# Patient Record
Sex: Male | Born: 1960
Health system: Southern US, Community
[De-identification: ages and names within clinical notes are randomized; demographics above are authoritative.]

---

## 1998-10-24 ENCOUNTER — Emergency Department (HOSPITAL_COMMUNITY): Admission: EM | Admit: 1998-10-24 | Discharge: 1998-10-24 | Payer: Self-pay

## 2000-11-04 ENCOUNTER — Emergency Department (HOSPITAL_COMMUNITY): Admission: EM | Admit: 2000-11-04 | Discharge: 2000-11-04 | Payer: Self-pay | Admitting: Emergency Medicine

## 2000-11-04 ENCOUNTER — Encounter: Payer: Self-pay | Admitting: Emergency Medicine

## 2005-08-14 ENCOUNTER — Encounter: Admission: RE | Admit: 2005-08-14 | Discharge: 2005-08-14 | Payer: Self-pay | Admitting: *Deleted

## 2016-03-10 DIAGNOSIS — H5213 Myopia, bilateral: Secondary | ICD-10-CM | POA: Diagnosis not present

## 2016-09-20 DIAGNOSIS — C7989 Secondary malignant neoplasm of other specified sites: Secondary | ICD-10-CM | POA: Diagnosis not present

## 2016-09-20 DIAGNOSIS — Z85238 Personal history of other malignant neoplasm of thymus: Secondary | ICD-10-CM | POA: Diagnosis not present

## 2016-10-27 ENCOUNTER — Ambulatory Visit: Payer: 59

## 2016-11-10 ENCOUNTER — Ambulatory Visit (INDEPENDENT_AMBULATORY_CARE_PROVIDER_SITE_OTHER): Payer: 59 | Admitting: Family Medicine

## 2016-11-10 DIAGNOSIS — Z23 Encounter for immunization: Secondary | ICD-10-CM

## 2016-12-04 ENCOUNTER — Ambulatory Visit (INDEPENDENT_AMBULATORY_CARE_PROVIDER_SITE_OTHER): Payer: 59 | Admitting: Family Medicine

## 2016-12-04 ENCOUNTER — Encounter: Payer: Self-pay | Admitting: Family Medicine

## 2016-12-04 VITALS — BP 110/78 | HR 64 | Temp 98.1°F | Resp 14 | Ht 70.0 in | Wt 173.0 lb

## 2016-12-04 DIAGNOSIS — J329 Chronic sinusitis, unspecified: Secondary | ICD-10-CM

## 2016-12-04 MED ORDER — AMOXICILLIN 875 MG PO TABS: 875.0000 mg | ORAL_TABLET | Freq: Two times a day (BID) | ORAL | 0 refills | Status: DC

## 2016-12-04 MED ORDER — PREDNISONE 20 MG PO TABS: ORAL_TABLET | ORAL | 0 refills | Status: DC

## 2016-12-04 NOTE — Progress Notes (Signed)
   Subjective:    Patient ID: Timothy Ingram, male    DOB: 02/19/1961, 56 y.o.   MRN: [id number]  HPI Symptoms started approximately 10 days ago.  Symptoms consist of  Pressure and congestion in his frontal and maxillary sinuses. Postnasal drip. Constant rhinorrhea. Sinus headaches in his frontal sinuses bilaterally. Subjective fevers and body aches.  Denies cough.  SOre throat due to PND. No past medical history on file. No past surgical history on file. No current outpatient prescriptions on file prior to visit.   No current facility-administered medications on file prior to visit.    No Known Allergies Social History   Social History  . Marital status: Single    Spouse name: N/A  . Number of children: N/A  . Years of education: N/A   Occupational History  . Not on file.   Social History Main Topics  . Smoking status: Never Smoker  . Smokeless tobacco: Never Used  . Alcohol use Not on file  . Drug use: Unknown  . Sexual activity: Not on file   Other Topics Concern  . Not on file   Social History Narrative  . No narrative on file      Review of Systems  All other systems reviewed and are negative.      Objective:   Physical Exam  Constitutional: He appears well-developed and well-nourished.  HENT:  Right Ear: Tympanic membrane, external ear and ear canal normal.  Left Ear: Tympanic membrane, external ear and ear canal normal.  Nose: Mucosal edema and rhinorrhea present. Right sinus exhibits frontal sinus tenderness. Left sinus exhibits frontal sinus tenderness.  Mouth/Throat: Oropharynx is clear and moist. No oropharyngeal exudate.  Eyes: Conjunctivae are normal.  Neck: Neck supple.  Cardiovascular: Normal rate and regular rhythm.   No murmur heard. Pulmonary/Chest: Effort normal and breath sounds normal. No respiratory distress. He has no wheezes. He has no rales.  Lymphadenopathy:    He has no cervical adenopathy.  Vitals reviewed.           Assessment & Plan:  Rhinosinusitis - Plan: amoxicillin (AMOXIL) 875 MG tablet, predniSONE (DELTASONE) 20 MG tablet  Start amoxicillin 875 mg by mouth twice a day for 10 days as well as prednisone taper  pack. Reassess in one week if no better or sooner if worse

## 2017-01-15 ENCOUNTER — Ambulatory Visit: Payer: 59

## 2017-01-19 ENCOUNTER — Ambulatory Visit: Payer: 59

## 2017-02-23 ENCOUNTER — Ambulatory Visit (INDEPENDENT_AMBULATORY_CARE_PROVIDER_SITE_OTHER): Payer: 59

## 2017-02-23 DIAGNOSIS — Z23 Encounter for immunization: Secondary | ICD-10-CM

## 2017-02-23 DIAGNOSIS — N400 Enlarged prostate without lower urinary tract symptoms: Secondary | ICD-10-CM | POA: Diagnosis not present

## 2017-02-23 NOTE — Progress Notes (Signed)
Patient was seen in office for shingrix vaccine. Patient received vaccine in his right deltoid.Patient tolerated well

## 2017-11-16 ENCOUNTER — Ambulatory Visit (INDEPENDENT_AMBULATORY_CARE_PROVIDER_SITE_OTHER): Payer: 59

## 2017-11-16 DIAGNOSIS — Z23 Encounter for immunization: Secondary | ICD-10-CM

## 2017-11-16 NOTE — Progress Notes (Signed)
Patient came in today to receive his annual influenza vaccine. Patient was given fluarix in the left deltoid. Patient tolerated well. VIS given to patient.

## 2018-03-29 DIAGNOSIS — N400 Enlarged prostate without lower urinary tract symptoms: Secondary | ICD-10-CM | POA: Diagnosis not present

## 2018-09-30 ENCOUNTER — Other Ambulatory Visit: Payer: Self-pay

## 2018-10-01 ENCOUNTER — Encounter: Payer: Self-pay | Admitting: Family Medicine

## 2018-10-01 ENCOUNTER — Ambulatory Visit (INDEPENDENT_AMBULATORY_CARE_PROVIDER_SITE_OTHER): Payer: 59 | Admitting: Family Medicine

## 2018-10-01 VITALS — BP 120/68 | HR 48 | Temp 97.7°F | Resp 16 | Ht 70.0 in | Wt 166.0 lb

## 2018-10-01 DIAGNOSIS — Z23 Encounter for immunization: Secondary | ICD-10-CM

## 2018-10-01 DIAGNOSIS — Z0001 Encounter for general adult medical examination with abnormal findings: Secondary | ICD-10-CM | POA: Diagnosis not present

## 2018-10-01 DIAGNOSIS — M898X1 Other specified disorders of bone, shoulder: Secondary | ICD-10-CM

## 2018-10-01 DIAGNOSIS — Z13220 Encounter for screening for lipoid disorders: Secondary | ICD-10-CM | POA: Diagnosis not present

## 2018-10-01 DIAGNOSIS — Z0000 Encounter for general adult medical examination without abnormal findings: Secondary | ICD-10-CM

## 2018-10-01 NOTE — Progress Notes (Signed)
Subjective:    Patient ID: Timothy Ingram, male    DOB: 23-Dec-1960, 58 y.o.   MRN: [id number]  HPI Patient is a very pleasant very healthy 58 year old Caucasian male here today for complete physical exam.  He has 1 medical concern.  He states that over the last month or so he has noticed that the proximal portion of his right clavicle seems to be swollen.  He also reports that with physical activity, it begins to hurt and is sore.  He denies any injury or trauma either currently or previously.  There is some mild prominence of the proximal portion of his right clavicle adjacent to the sternum however there is no palpable defect or swelling.  It is only minimally tender to palpation.  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  Otherwise the patient is doing well.  His last colonoscopy was at age 23.  He is due for that next year.  He denies any blood in his stool.  He denies any gastrointestinal problems.  He sees a urologist every year he checks his prostate and also checks a PSA.  Therefore this is up-to-date.  I reviewed his immunization records below: Immunization History  Administered Date(s) Administered  . Influenza,inj,Quad PF,6+ Mos 11/10/2016, 11/16/2017  . Zoster Recombinat (Shingrix) 11/10/2016, 02/23/2017   He is due for a flu shot this fall.  He is due for a booster on his tetanus today.  He elects to receive that given his profession.  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 No past medical history on file.  No current outpatient medications on file prior to visit.   No current facility-administered medications on file prior to visit.     No Known Allergies Social History   Socioeconomic History  . Marital status: Single    Spouse name: Not on file  . Number of children: Not on file  . Years of education: Not on file  . Highest education level: Not on file  Occupational History  . Not on file  Social  Needs  . Financial resource strain: Not on file  . Food insecurity    Worry: Not on file    Inability: Not on file  . Transportation needs    Medical: Not on file    Non-medical: Not on file  Tobacco Use  . Smoking status: Never Smoker  . Smokeless tobacco: Never Used  Substance and Sexual Activity  . Alcohol use: Not on file  . Drug use: Not on file  . Sexual activity: Not on file  Lifestyle  . Physical activity    Days per week: Not on file    Minutes per session: Not on file  . Stress: Not on file  Relationships  . Social Herbalist on phone: Not on file    Gets together: Not on file    Attends religious service: Not on file    Active member of club or organization: Not on file    Attends meetings of clubs or organizations: Not on file    Relationship status: Not on file  . Intimate partner violence    Fear of current or ex partner: Not on file    Emotionally abused: Not on file    Physically abused: Not on file    Forced sexual activity: Not on file  Other Topics Concern  . Not on file  Social History Narrative  . Not on  file   Patient does not smoke.  He does not drink.  He does not chew or dip tobacco.  He works as a Clinical cytogeneticist for [employer].  He is married to [place].  She has malignant thymoma and is battling cancer. No family history on file. Father died recently from heart disease at an advanced age.  Patient denies any family history of colon cancer or prostate cancer.  Review of Systems  All other systems reviewed and are negative.      Objective:   Physical Exam Vitals signs reviewed.  Constitutional:      General: He is not in acute distress.    Appearance: Normal appearance. He is normal weight. He is not ill-appearing, toxic-appearing or diaphoretic.  HENT:     Head: Normocephalic and atraumatic.     Right Ear: Tympanic membrane, ear canal and external ear normal. There is no impacted cerumen.     Left Ear: Ear canal and  external ear normal. There is no impacted cerumen.     Nose: Nose normal. No congestion or rhinorrhea.     Mouth/Throat:     Mouth: Mucous membranes are moist.     Pharynx: Oropharynx is clear. No oropharyngeal exudate or posterior oropharyngeal erythema.  Eyes:     General: No scleral icterus.       Right eye: No discharge.        Left eye: No discharge.     Extraocular Movements: Extraocular movements intact.     Conjunctiva/sclera: Conjunctivae normal.     Pupils: Pupils are equal, round, and reactive to light.  Neck:     Musculoskeletal: Normal range of motion. No neck rigidity or muscular tenderness.     Vascular: No carotid bruit.  Cardiovascular:     Rate and Rhythm: Normal rate and regular rhythm.     Pulses: Normal pulses.     Heart sounds: Normal heart sounds. No murmur. No friction rub. No gallop.   Pulmonary:     Effort: Pulmonary effort is normal. No respiratory distress.     Breath sounds: Normal breath sounds. No stridor. No wheezing, rhonchi or rales.  Chest:     Chest wall: No tenderness.     Breasts:        Left: Tenderness present.    Abdominal:     General: Bowel sounds are normal. There is no distension.     Palpations: Abdomen is soft. There is no mass.     Tenderness: There is no abdominal tenderness. There is no guarding or rebound.     Hernia: No hernia is present.  Musculoskeletal:        General: Tenderness present. No swelling, deformity or signs of injury.     Right lower leg: No edema.     Left lower leg: No edema.  Lymphadenopathy:     Cervical: No cervical adenopathy.  Skin:    General: Skin is warm.     Coloration: Skin is not jaundiced or pale.     Findings: No bruising, erythema, lesion or rash.  Neurological:     General: No focal deficit present.     Mental Status: He is alert and oriented to person, place, and time. Mental status is at baseline.     Cranial Nerves: No cranial nerve deficit.     Sensory: No sensory deficit.      Motor: No weakness.     Coordination: Coordination normal.     Gait: Gait normal.  Deep Tendon Reflexes: Reflexes normal.  Psychiatric:        Mood and Affect: Mood normal.        Behavior: Behavior normal.        Thought Content: Thought content normal.        Judgment: Judgment normal.           Assessment & Plan:  The primary encounter diagnosis was General medical exam. Diagnoses of Pain of right clavicle and Screening cholesterol level were also pertinent to this visit. Physical exam today is completely normal.  Blood pressure is excellent.  I will check a CBC, CMP, and fasting lipid panel.  Colonoscopy is up-to-date.  Prostate and PSA are checked by urologist.  Patient received his tetanus shot today.  The remainder of his physical exam is normal aside from the tenderness over his proximal right clavicle.  We will obtain an x-ray however I suspect this is osteoarthritic change due to a previous injury.  We will obtain x-ray to rule out any pathologic features.

## 2018-10-01 NOTE — Addendum Note (Signed)
Addended by: Shary Decamp B on: 10/01/2018 11:02 AM   Modules accepted: Orders

## 2018-10-02 LAB — CBC WITH DIFFERENTIAL/PLATELET
Absolute Monocytes: 409 cells/uL (ref 200–950)
Basophils Absolute: 61 cells/uL (ref 0–200)
Basophils Relative: 1.3 %
Eosinophils Absolute: 71 cells/uL (ref 15–500)
Eosinophils Relative: 1.5 %
HCT: 44.8 % (ref 38.5–50.0)
Hemoglobin: 15.3 g/dL (ref 13.2–17.1)
Lymphs Abs: 1053 cells/uL (ref 850–3900)
MCH: 31.1 pg (ref 27.0–33.0)
MCHC: 34.2 g/dL (ref 32.0–36.0)
MCV: 91.1 fL (ref 80.0–100.0)
MPV: 9.9 fL (ref 7.5–12.5)
Monocytes Relative: 8.7 %
Neutro Abs: 3107 cells/uL (ref 1500–7800)
Neutrophils Relative %: 66.1 %
Platelets: 217 10*3/uL (ref 140–400)
RBC: 4.92 10*6/uL (ref 4.20–5.80)
RDW: 12.3 % (ref 11.0–15.0)
Total Lymphocyte: 22.4 %
WBC: 4.7 10*3/uL (ref 3.8–10.8)

## 2018-10-02 LAB — COMPLETE METABOLIC PANEL WITH GFR
AG Ratio: 1.9 (calc) (ref 1.0–2.5)
ALT: 16 U/L (ref 9–46)
AST: 19 U/L (ref 10–35)
Albumin: 4.2 g/dL (ref 3.6–5.1)
Alkaline phosphatase (APISO): 71 U/L (ref 35–144)
BUN: 11 mg/dL (ref 7–25)
CO2: 29 mmol/L (ref 20–32)
Calcium: 9.3 mg/dL (ref 8.6–10.3)
Chloride: 104 mmol/L (ref 98–110)
Creat: 0.97 mg/dL (ref 0.70–1.33)
GFR, Est African American: 99 mL/min/{1.73_m2} (ref >=60)
GFR, Est Non African American: 85 mL/min/{1.73_m2} (ref >=60)
Globulin: 2.2 g/dL (calc) (ref 1.9–3.7)
Glucose, Bld: 84 mg/dL (ref 65–99)
Potassium: 4.3 mmol/L (ref 3.5–5.3)
Sodium: 140 mmol/L (ref 135–146)
Total Bilirubin: 0.9 mg/dL (ref 0.2–1.2)
Total Protein: 6.4 g/dL (ref 6.1–8.1)

## 2018-10-02 LAB — LIPID PANEL
Cholesterol: 186 mg/dL
HDL: 55 mg/dL
LDL Cholesterol (Calc): 111 mg/dL — ABNORMAL HIGH
Non-HDL Cholesterol (Calc): 131 mg/dL — ABNORMAL HIGH
Total CHOL/HDL Ratio: 3.4 (calc)
Triglycerides: 94 mg/dL

## 2018-10-31 ENCOUNTER — Ambulatory Visit (INDEPENDENT_AMBULATORY_CARE_PROVIDER_SITE_OTHER): Payer: 59 | Admitting: *Deleted

## 2018-10-31 ENCOUNTER — Other Ambulatory Visit: Payer: Self-pay

## 2018-10-31 DIAGNOSIS — Z23 Encounter for immunization: Secondary | ICD-10-CM

## 2018-10-31 NOTE — Progress Notes (Signed)
Patient seen in office for Influenza Vaccination.   Tolerated IM administration well.   Immunization history updated.  

## 2018-11-01 ENCOUNTER — Ambulatory Visit
Admission: RE | Admit: 2018-11-01 | Discharge: 2018-11-01 | Disposition: A | Discharge status: Home / Self Care | Payer: 59 | Source: Ambulatory Visit | Attending: Family Medicine | Admitting: Family Medicine

## 2018-11-01 DIAGNOSIS — M898X1 Other specified disorders of bone, shoulder: Secondary | ICD-10-CM

## 2019-04-18 ENCOUNTER — Encounter: Payer: Self-pay | Admitting: Family Medicine

## 2019-04-18 ENCOUNTER — Other Ambulatory Visit: Payer: Self-pay

## 2019-04-18 ENCOUNTER — Ambulatory Visit: Payer: 59 | Admitting: Family Medicine

## 2019-04-18 VITALS — BP 130/74 | HR 54 | Temp 96.7°F | Resp 14 | Ht 70.0 in | Wt 176.0 lb

## 2019-04-18 DIAGNOSIS — L989 Disorder of the skin and subcutaneous tissue, unspecified: Secondary | ICD-10-CM | POA: Diagnosis not present

## 2019-04-18 NOTE — Progress Notes (Signed)
   Subjective:    Patient ID: Timothy Ingram, male    DOB: 10-Jun-1960, 59 y.o.   MRN: [id number]  HPI  Patient has a lesion that has been on the dorsum of the right forearm for about 6 months.  It is scaling and occasionally bleeding.  Please see the photograph below.  He presents today requesting a biopsy of this lesion.   Lesion is 5 mm to 6 mm in diameter.  It is an erythematous-based hard white scaly papule. No past medical history on file.  No Known Allergies Social History   Socioeconomic History  . Marital status: Married    Spouse name: Not on file  . Number of children: Not on file  . Years of education: Not on file  . Highest education level: Not on file  Occupational History  . Not on file  Tobacco Use  . Smoking status: Never Smoker  . Smokeless tobacco: Never Used  Substance and Sexual Activity  . Alcohol use: Not on file  . Drug use: Not on file  . Sexual activity: Not on file  Other Topics Concern  . Not on file  Social History Narrative  . Not on file   Social Determinants of Health   Financial Resource Strain:   . Difficulty of Paying Living Expenses: Not on file  Food Insecurity:   . Worried About Charity fundraiser in the Last Year: Not on file  . Ran Out of Food in the Last Year: Not on file  Transportation Needs:   . Lack of Transportation (Medical): Not on file  . Lack of Transportation (Non-Medical): Not on file  Physical Activity:   . Days of Exercise per Week: Not on file  . Minutes of Exercise per Session: Not on file  Stress:   . Feeling of Stress : Not on file  Social Connections:   . Frequency of Communication with Friends and Family: Not on file  . Frequency of Social Gatherings with Friends and Family: Not on file  . Attends Religious Services: Not on file  . Active Member of Clubs or Organizations: Not on file  . Attends Archivist Meetings: Not on file  . Marital Status: Not on file  Intimate Partner Violence:   .  Fear of Current or Ex-Partner: Not on file  . Emotionally Abused: Not on file  . Physically Abused: Not on file  . Sexually Abused: Not on file     Review of Systems  All other systems reviewed and are negative.      Objective:   Physical Exam Vitals reviewed.  Cardiovascular:     Rate and Rhythm: Normal rate and regular rhythm.  Pulmonary:     Effort: Pulmonary effort is normal.     Breath sounds: Normal breath sounds.  Skin:    Findings: Lesion present.                Assessment & Plan:  Skin lesion of right arm - Plan: Pathology Report ([facility])  Using sterile technique, I anesthetized the lesion on the anterior right forearm with 0.1% lidocaine with epinephrine.  A shave biopsy was then performed of the entire lesion and it was sent to pathology in a labeled container.  Hemostasis was achieved with Drysol and a Band-Aid.  Patient tolerated the procedure well without complication.  Await biopsy results.

## 2019-04-22 LAB — TISSUE SPECIMEN

## 2019-04-22 LAB — PATHOLOGY REPORT

## 2019-11-21 ENCOUNTER — Other Ambulatory Visit (INDEPENDENT_AMBULATORY_CARE_PROVIDER_SITE_OTHER): Payer: 59

## 2019-11-21 DIAGNOSIS — Z23 Encounter for immunization: Secondary | ICD-10-CM | POA: Diagnosis not present

## 2019-11-26 ENCOUNTER — Ambulatory Visit: Payer: 59

## 2019-12-12 ENCOUNTER — Other Ambulatory Visit: Payer: Self-pay

## 2019-12-12 ENCOUNTER — Ambulatory Visit (INDEPENDENT_AMBULATORY_CARE_PROVIDER_SITE_OTHER): Payer: 59 | Admitting: Family Medicine

## 2019-12-12 VITALS — BP 110/80 | HR 58 | Temp 97.9°F | Ht 70.0 in | Wt 170.0 lb

## 2019-12-12 DIAGNOSIS — Z0000 Encounter for general adult medical examination without abnormal findings: Secondary | ICD-10-CM | POA: Diagnosis not present

## 2019-12-12 DIAGNOSIS — Z1211 Encounter for screening for malignant neoplasm of colon: Secondary | ICD-10-CM

## 2019-12-12 DIAGNOSIS — Z1159 Encounter for screening for other viral diseases: Secondary | ICD-10-CM | POA: Diagnosis not present

## 2019-12-12 NOTE — Progress Notes (Signed)
Subjective:    Patient ID: Timothy Ingram, male    DOB: Jan 05, 1961, 59 y.o.   MRN: [id number]  HPI Patient is a very pleasant 59 year old Caucasian male here today for complete physical exam.  He denies any medical concerns.  He is already had his flu shot and his shingles shot and his tetanus shot is well up-to-date.  He is due for the Covid shot and he assures me that he is taking care of it.  Otherwise he is doing well with no concerns.  He is due for a colonoscopy.  He also sees a urologist every year for a PSA and a rectal exam.  This was done in January and was normal per his report Immunization History  Administered Date(s) Administered  . Influenza,inj,Quad PF,6+ Mos 11/10/2016, 11/16/2017, 10/31/2018, 11/21/2019  . Tdap 10/01/2018  . Zoster Recombinat (Shingrix) 11/10/2016, 02/23/2017    No past medical history on file.  No current outpatient medications on file prior to visit.   No current facility-administered medications on file prior to visit.    No Known Allergies Social History   Socioeconomic History  . Marital status: Married    Spouse name: Not on file  . Number of children: Not on file  . Years of education: Not on file  . Highest education level: Not on file  Occupational History  . Not on file  Tobacco Use  . Smoking status: Never Smoker  . Smokeless tobacco: Never Used  Substance and Sexual Activity  . Alcohol use: Not on file  . Drug use: Not on file  . Sexual activity: Not on file  Other Topics Concern  . Not on file  Social History Narrative  . Not on file   Social Determinants of Health   Financial Resource Strain:   . Difficulty of Paying Living Expenses: Not on file  Food Insecurity:   . Worried About Charity fundraiser in the Last Year: Not on file  . Ran Out of Food in the Last Year: Not on file  Transportation Needs:   . Lack of Transportation (Medical): Not on file  . Lack of Transportation (Non-Medical): Not on file  Physical  Activity:   . Days of Exercise per Week: Not on file  . Minutes of Exercise per Session: Not on file  Stress:   . Feeling of Stress : Not on file  Social Connections:   . Frequency of Communication with Friends and Family: Not on file  . Frequency of Social Gatherings with Friends and Family: Not on file  . Attends Religious Services: Not on file  . Active Member of Clubs or Organizations: Not on file  . Attends Archivist Meetings: Not on file  . Marital Status: Not on file  Intimate Partner Violence:   . Fear of Current or Ex-Partner: Not on file  . Emotionally Abused: Not on file  . Physically Abused: Not on file  . Sexually Abused: Not on file   Patient does not smoke.  He does not drink.  He does not chew or dip tobacco.  He works as a Clinical cytogeneticist for [employer].  He is married to [place].  She has malignant thymoma and is battling cancer. No family history on file. Father died recently from heart disease at an advanced age.  Patient denies any family history of colon cancer or prostate cancer.  Review of Systems  All other systems reviewed and are negative.  Objective:   Physical Exam Vitals reviewed.  Constitutional:      General: He is not in acute distress.    Appearance: Normal appearance. He is normal weight. He is not ill-appearing, toxic-appearing or diaphoretic.  HENT:     Head: Normocephalic and atraumatic.     Right Ear: Tympanic membrane, ear canal and external ear normal. There is no impacted cerumen.     Left Ear: Ear canal and external ear normal. There is no impacted cerumen.     Nose: Nose normal. No congestion or rhinorrhea.     Mouth/Throat:     Mouth: Mucous membranes are moist.     Pharynx: Oropharynx is clear. No oropharyngeal exudate or posterior oropharyngeal erythema.  Eyes:     General: No scleral icterus.       Right eye: No discharge.        Left eye: No discharge.     Extraocular Movements: Extraocular movements  intact.     Conjunctiva/sclera: Conjunctivae normal.     Pupils: Pupils are equal, round, and reactive to light.  Neck:     Vascular: No carotid bruit.  Cardiovascular:     Rate and Rhythm: Normal rate and regular rhythm.     Pulses: Normal pulses.     Heart sounds: Normal heart sounds. No murmur heard.  No friction rub. No gallop.   Pulmonary:     Effort: Pulmonary effort is normal. No respiratory distress.     Breath sounds: Normal breath sounds. No stridor. No wheezing, rhonchi or rales.  Chest:     Chest wall: No tenderness.  Abdominal:     General: Bowel sounds are normal. There is no distension.     Palpations: Abdomen is soft. There is no mass.     Tenderness: There is no abdominal tenderness. There is no guarding or rebound.     Hernia: No hernia is present.  Musculoskeletal:        General: No swelling, deformity or signs of injury.     Cervical back: Normal range of motion. No rigidity. No muscular tenderness.     Right lower leg: No edema.     Left lower leg: No edema.  Lymphadenopathy:     Cervical: No cervical adenopathy.  Skin:    General: Skin is warm.     Coloration: Skin is not jaundiced or pale.     Findings: No bruising, erythema, lesion or rash.  Neurological:     General: No focal deficit present.     Mental Status: He is alert and oriented to person, place, and time. Mental status is at baseline.     Cranial Nerves: No cranial nerve deficit.     Sensory: No sensory deficit.     Motor: No weakness.     Coordination: Coordination normal.     Gait: Gait normal.     Deep Tendon Reflexes: Reflexes normal.  Psychiatric:        Mood and Affect: Mood normal.        Behavior: Behavior normal.        Thought Content: Thought content normal.        Judgment: Judgment normal.           Assessment & Plan:  The primary encounter diagnosis was Colon cancer screening. Diagnoses of General medical exam and Encounter for hepatitis C screening test for low risk  patient were also pertinent to this visit. Physical exam today is completely normal.  Blood pressure is excellent.  I will check a CBC, CMP, and fasting lipid panel.  I will schedule the patient for a colonoscopy.  Screen for hepatitis C.  Encourage the patient to get a Covid vaccine.  The remainder of his preventative care is up-to-date.

## 2019-12-15 LAB — COMPLETE METABOLIC PANEL WITH GFR
AG Ratio: 1.9 (calc) (ref 1.0–2.5)
ALT: 12 U/L (ref 9–46)
AST: 20 U/L (ref 10–35)
Albumin: 4.2 g/dL (ref 3.6–5.1)
Alkaline phosphatase (APISO): 79 U/L (ref 35–144)
BUN: 15 mg/dL (ref 7–25)
CO2: 26 mmol/L (ref 20–32)
Calcium: 9.2 mg/dL (ref 8.6–10.3)
Chloride: 102 mmol/L (ref 98–110)
Creat: 0.98 mg/dL (ref 0.70–1.25)
GFR, Est African American: 97 mL/min/{1.73_m2} (ref >=60)
GFR, Est Non African American: 83 mL/min/{1.73_m2} (ref >=60)
Globulin: 2.2 g/dL (calc) (ref 1.9–3.7)
Glucose, Bld: 85 mg/dL (ref 65–99)
Potassium: 4.2 mmol/L (ref 3.5–5.3)
Sodium: 138 mmol/L (ref 135–146)
Total Bilirubin: 0.9 mg/dL (ref 0.2–1.2)
Total Protein: 6.4 g/dL (ref 6.1–8.1)

## 2019-12-15 LAB — CBC WITH DIFFERENTIAL/PLATELET
Absolute Monocytes: 409 cells/uL (ref 200–950)
Basophils Absolute: 69 cells/uL (ref 0–200)
Basophils Relative: 1.6 %
Eosinophils Absolute: 60 cells/uL (ref 15–500)
Eosinophils Relative: 1.4 %
HCT: 42.7 % (ref 38.5–50.0)
Hemoglobin: 14.8 g/dL (ref 13.2–17.1)
Lymphs Abs: 1148 cells/uL (ref 850–3900)
MCH: 31.8 pg (ref 27.0–33.0)
MCHC: 34.7 g/dL (ref 32.0–36.0)
MCV: 91.8 fL (ref 80.0–100.0)
MPV: 10.6 fL (ref 7.5–12.5)
Monocytes Relative: 9.5 %
Neutro Abs: 2614 cells/uL (ref 1500–7800)
Neutrophils Relative %: 60.8 %
Platelets: 222 10*3/uL (ref 140–400)
RBC: 4.65 10*6/uL (ref 4.20–5.80)
RDW: 12.3 % (ref 11.0–15.0)
Total Lymphocyte: 26.7 %
WBC: 4.3 10*3/uL (ref 3.8–10.8)

## 2019-12-15 LAB — LIPID PANEL
Cholesterol: 187 mg/dL (ref <200)
HDL: 57 mg/dL (ref >=40)
LDL Cholesterol (Calc): 116 mg/dL (calc) — ABNORMAL HIGH
Non-HDL Cholesterol (Calc): 130 mg/dL (calc) — ABNORMAL HIGH (ref <130)
Total CHOL/HDL Ratio: 3.3 (calc) (ref <5.0)
Triglycerides: 57 mg/dL (ref <150)

## 2019-12-15 LAB — HEPATITIS C ANTIBODY
Hepatitis C Ab: NONREACTIVE
SIGNAL TO CUT-OFF: 0.01 (ref <1.00)

## 2020-11-23 ENCOUNTER — Other Ambulatory Visit: Payer: Self-pay

## 2020-11-23 ENCOUNTER — Ambulatory Visit (INDEPENDENT_AMBULATORY_CARE_PROVIDER_SITE_OTHER): Payer: 59 | Admitting: *Deleted

## 2020-11-23 DIAGNOSIS — Z23 Encounter for immunization: Secondary | ICD-10-CM

## 2021-02-08 ENCOUNTER — Other Ambulatory Visit: Payer: Self-pay | Admitting: Family Medicine

## 2021-02-08 MED ORDER — AMOXICILLIN 875 MG PO TABS: 875.0000 mg | ORAL_TABLET | Freq: Two times a day (BID) | ORAL | 0 refills | Status: AC

## 2021-02-28 ENCOUNTER — Telehealth (INDEPENDENT_AMBULATORY_CARE_PROVIDER_SITE_OTHER): Payer: 59 | Admitting: Nurse Practitioner

## 2021-02-28 ENCOUNTER — Other Ambulatory Visit: Payer: Self-pay

## 2021-02-28 ENCOUNTER — Encounter: Payer: Self-pay | Admitting: Nurse Practitioner

## 2021-02-28 DIAGNOSIS — J069 Acute upper respiratory infection, unspecified: Secondary | ICD-10-CM

## 2021-02-28 NOTE — Progress Notes (Signed)
Subjective:    Patient ID: Timothy Ingram, male    DOB: Dec 16, 1960, 61 y.o.   MRN: [id number]  HPI: ALEXXANDER Ingram is a 61 y.o. male presenting virtually for sinusitis.  Chief Complaint  Patient presents with   Sinusitis   UPPER RESPIRATORY TRACT INFECTION Onset: 02/22/2021 Fever: no Body aches: no Chills: no Cough: yes; congested sometimes Shortness of breath: no Wheezing: no Chest pain: no Chest tightness: no Chest congestion: no Nasal congestion: yes Runny nose: yes - green/yellow Post nasal drip: yes Sneezing: no Sore throat: no Swollen glands: no Sinus pressure:  yes; above eyes on sides Headache: no Face pain: no Toothache: no Ear pain: no  Ear pressure: no  Eyes red/itching:no Eye drainage/crusting: no  Nausea: no  Vomiting: no Diarrhea: no  Change in appetite: no  Loss of taste/smell: no  Rash: no; had 1 itchy spot that went away after fever Fatigue: no Sick contacts:  yes; wife is sick and daughter was sick  Strep contacts: no  Context: better Recurrent sinusitis: no Treatments attempted: Mucinex, pushing fluids   Relief with OTC medications: yes  No Known Allergies  No outpatient encounter medications on file as of 02/28/2021.   No facility-administered encounter medications on file as of 02/28/2021.    There are no problems to display for this patient.   No past medical history on file.  Relevant past medical, surgical, family and social history reviewed and updated as indicated. Interim medical history since our last visit reviewed.  Review of Systems Per HPI unless specifically indicated above     Objective:    There were no vitals taken for this visit.  Wt Readings from Last 3 Encounters:  12/12/19 170 lb (77.1 kg)  04/18/19 176 lb (79.8 kg)  10/01/18 166 lb (75.3 kg)    Physical Exam Physical examination unable to be performed due to lack of equipment.  Patient talking in complete sentences during telemedicine visit.     Assessment & Plan:  1. Viral upper respiratory tract infection Acute x 6 days.  Reassured patient that symptoms are most consistent with a viral upper respiratory infection and explained lack of efficacy of antibiotics against viruses.  Discussed expected course and features suggestive of secondary bacterial infection.  Continue supportive care. Increase fluid intake with water or electrolyte solution like pedialyte. Encouraged acetaminophen as needed for fever/pain. Encouraged salt water gargling, chloraseptic spray and throat lozenges. Encouraged OTC guaifenesin. Encouraged saline sinus flushes and/or neti with humidified air.  Patient declines cough suppressant prescription for now.  He will follow up with [place] later this week if his symptoms are not continuing to improve or if they worsen.   Follow up plan: Return if symptoms worsen or fail to improve.  This visit was completed via telephone due to the restrictions of the COVID-19 pandemic. All issues as above were discussed and addressed but no physical exam was performed. If it was felt that the patient should be evaluated in the office, they were directed there. The patient verbally consented to this visit. Patient was unable to complete an audio/visual visit due to Technical difficulties.  Patient did not receive text message to start video visit.  Visit then transitioned to a telephone visit.  Location of the patient: work Location of the provider: work Those involved with this call:  Provider: Noemi Chapel, DNP, FNP-C CMA: Elizabeth Palau, CMA Front Desk/Registration: Vevelyn Pat  Time spent on call:  6 minutes on the phone discussing health  concerns. 12 minutes total spent in review of patient's record and preparation of their chart. I verified patient identity using two factors (patient name and date of birth). Patient consents verbally to being seen via telemedicine visit today.

## 2021-03-21 IMAGING — DX DG CLAVICLE*R*
2 series · 2 of 2 positions shown · non-contrast
Comparison: None.

CLINICAL DATA: Proximal pain and swelling

EXAM:
RIGHT CLAVICLE - 2+ VIEWS

[dg clavicle right (1 of 2)]
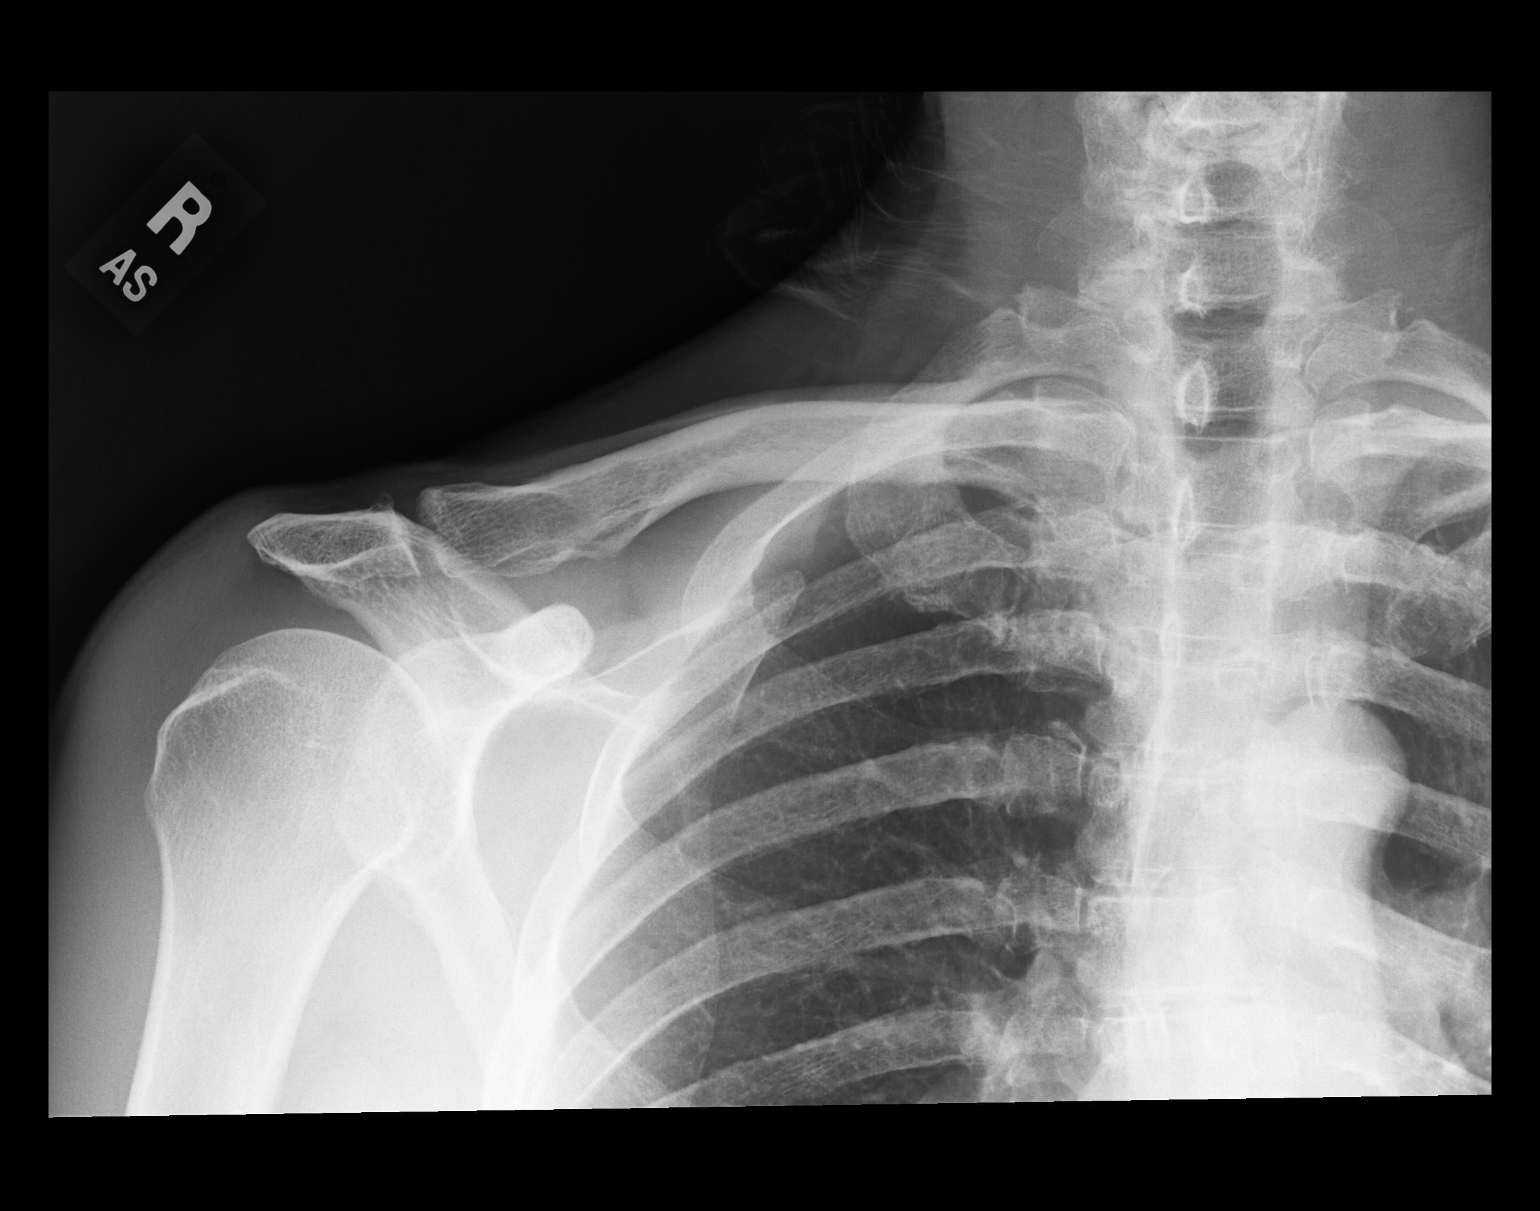

[dg clavicle right (2 of 2)]
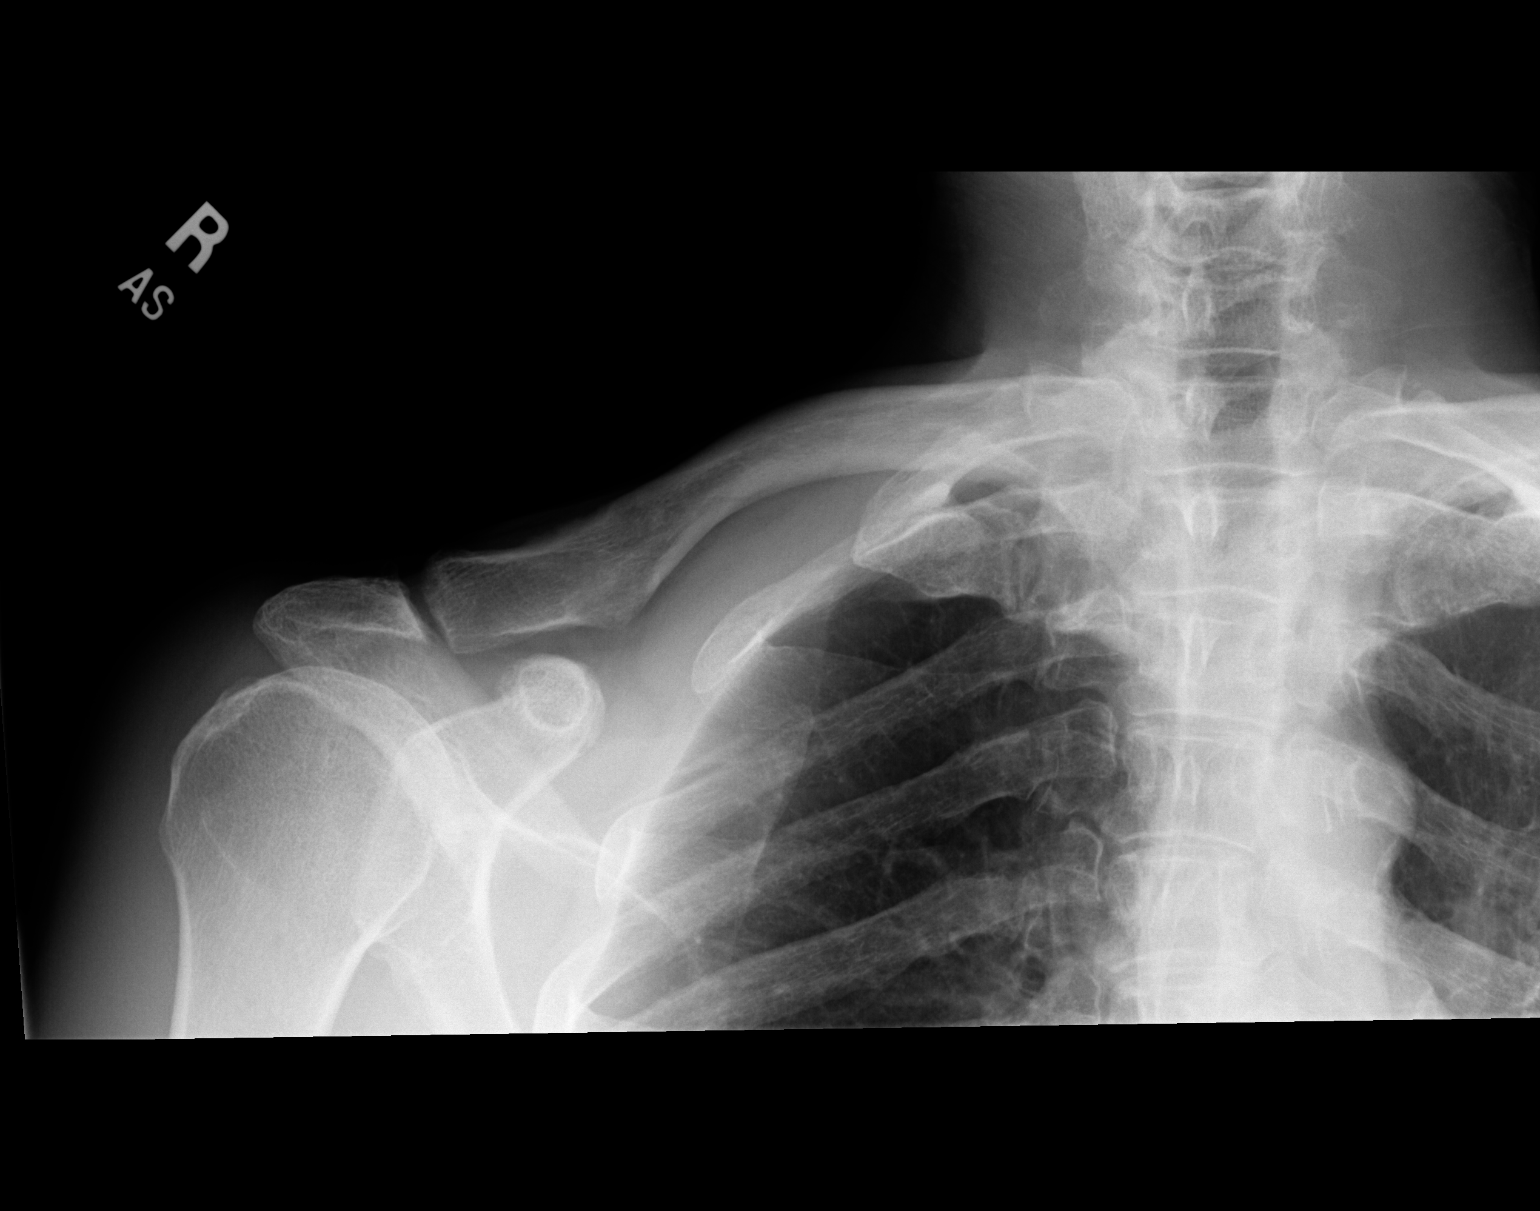

[2 of 2 positions shown; findings below may reference images not displayed]

FINDINGS: Mild AC joint degenerative change. No fracture or acute osseous
abnormality is seen
IMPRESSION: No acute osseous abnormality. Further evaluation with CT or MRI
could be obtained depending on the clinical course.

## 2021-11-30 ENCOUNTER — Ambulatory Visit (INDEPENDENT_AMBULATORY_CARE_PROVIDER_SITE_OTHER): Payer: 59

## 2021-11-30 DIAGNOSIS — Z0000 Encounter for general adult medical examination without abnormal findings: Secondary | ICD-10-CM

## 2021-11-30 DIAGNOSIS — Z23 Encounter for immunization: Secondary | ICD-10-CM

## 2022-11-24 ENCOUNTER — Ambulatory Visit: Payer: 59

## 2022-12-08 ENCOUNTER — Ambulatory Visit: Payer: 59

## 2022-12-20 ENCOUNTER — Ambulatory Visit (INDEPENDENT_AMBULATORY_CARE_PROVIDER_SITE_OTHER): Payer: 59

## 2022-12-20 DIAGNOSIS — Z23 Encounter for immunization: Secondary | ICD-10-CM | POA: Diagnosis not present

## 2022-12-20 NOTE — Progress Notes (Signed)
Pt came in for flu vaccine. Injection was given to pt on the L-upper arm. Pt tol injection well w/no c/o per pt. Pt left ambulatory w/no c/o

## 2023-01-04 ENCOUNTER — Encounter: Payer: Self-pay | Admitting: Family Medicine

## 2023-01-04 ENCOUNTER — Ambulatory Visit: Payer: 59 | Admitting: Family Medicine

## 2023-01-04 VITALS — BP 138/82 | HR 53 | Temp 97.8°F | Ht 70.0 in | Wt 171.0 lb

## 2023-01-04 DIAGNOSIS — R21 Rash and other nonspecific skin eruption: Secondary | ICD-10-CM | POA: Diagnosis not present

## 2023-01-04 MED ORDER — FLUOCINONIDE 0.05 % EX CREA: 1.0000 | TOPICAL_CREAM | Freq: Two times a day (BID) | CUTANEOUS | 0 refills | Status: AC

## 2023-01-04 NOTE — Assessment & Plan Note (Signed)
Rash appears consistent with nummular eczema, no systemic symptoms present. Will treat with Lidex 0.05% cream BID for up to 4 weeks and test for Lyme. Instructed to return to office for new or worsening symptoms or if not relieved with 4 weeks of treatment.,

## 2023-01-04 NOTE — Progress Notes (Signed)
   Subjective:  HPI: Timothy Ingram is a 62 y.o. male presenting on 01/04/2023 for Acute Visit (Rash on both legs/)   HPI Patient is in today for 1 month of red, patchy rash that started on his legs and is spreading to his torso. It does not itch, burn, or drain. No known allergen exposures, new medications, detergents, or soaps. He did have the flu a little over a month ago and had a deer tick bite this summer. Denies fever, chills, body aches, N/V/D, fatigue, headaches, or other symptoms. Has tried a cream inconsistently at home, unsure what kind.  Review of Systems  All other systems reviewed and are negative.   Relevant past medical history reviewed and updated as indicated.   No past medical history on file.     Allergies and medications reviewed and updated.   Current Outpatient Medications:    fluocinonide cream (LIDEX) 0.05 %, Apply 1 Application topically 2 (two) times daily. Up to 4 weeks, Disp: 120 g, Rfl: 0  No Known Allergies  Objective:   BP 138/82   Pulse (!) 53   Temp 97.8 F (36.6 C) (Oral)   Ht 5\' 10"  (1.778 m)   Wt 171 lb (77.6 kg)   SpO2 93%   BMI 24.54 kg/m      01/04/2023    9:24 AM 12/12/2019    8:11 AM 04/18/2019   11:22 AM  Vitals with BMI  Height 5\' 10"  5\' 10"  5\' 10"   Weight 171 lbs 170 lbs 176 lbs  BMI 24.54 24.39 25.25  Systolic 138 110 742  Diastolic 82 80 74  Pulse 53 58 54     Physical Exam Vitals and nursing note reviewed.  Constitutional:      Appearance: Normal appearance. He is normal weight.  HENT:     Head: Normocephalic and atraumatic.  Skin:    General: Skin is warm and dry.     Capillary Refill: Capillary refill takes less than 2 seconds.     Findings: Rash present.     Comments: Scattered pink eczematous patches to bilateral thighs and torso  Neurological:     General: No focal deficit present.     Mental Status: He is alert and oriented to person, place, and time. Mental status is at baseline.  Psychiatric:         Mood and Affect: Mood normal.        Behavior: Behavior normal.        Thought Content: Thought content normal.        Judgment: Judgment normal.     Assessment & Plan:  Rash and nonspecific skin eruption Assessment & Plan: Rash appears consistent with nummular eczema, no systemic symptoms present. Will treat with Lidex 0.05% cream BID for up to 4 weeks and test for Lyme. Instructed to return to office for new or worsening symptoms or if not relieved with 4 weeks of treatment.,  Orders: -     Lyme Disease (Borrelia spp) DNA, Qualitative Real-Time PCR, Blood  Other orders -     Fluocinonide; Apply 1 Application topically 2 (two) times daily. Up to 4 weeks  Dispense: 120 g; Refill: 0     Follow up plan: Return if symptoms worsen or fail to improve.  Park Meo, FNP

## 2023-01-04 NOTE — Patient Instructions (Signed)
Limit bathing to once daily with lukewarm water using mild, nonsoap cleansers. Apply a moisturizer at least once, preferably twice, daily and immediately after bathing. The author prefers cream preparations over lotions due to the increased lipid content of creams compared with lotions. In addition, evidence has shown that a cream containing a mixture of ceramides is more effective at reducing water loss through the skin than traditional moisturizers. Use laundry detergents that are based primarily on nonionic surfactants (laundry detergents for sensitive skin), which have the lowest potential for skin irritancy, and/or double rinse the laundry to minimize residual laundry detergent in clothing. Consider obtaining a whole house humidifier or a room humidifier for the bedroom.

## 2023-01-07 LAB — LYME DISEASE (BORRELIA SPP) DNA, QUALITATIVE REAL-TIME PCR, D: LYME DISEASE (BORRELIA SPP)DNA,QL RT PCR,BLOOD: NOT DETECTED

## 2023-11-29 ENCOUNTER — Ambulatory Visit (INDEPENDENT_AMBULATORY_CARE_PROVIDER_SITE_OTHER)

## 2023-11-29 DIAGNOSIS — Z23 Encounter for immunization: Secondary | ICD-10-CM
